# Patient Record
Sex: Male | Born: 1960 | Race: Black or African American | Hispanic: No | Smoking: Light tobacco smoker
Health system: Southern US, Community
[De-identification: ages and names within clinical notes are randomized; demographics above are authoritative.]

## PROBLEM LIST (undated history)

## (undated) DIAGNOSIS — I639 Cerebral infarction, unspecified: Secondary | ICD-10-CM

## (undated) DIAGNOSIS — J449 Chronic obstructive pulmonary disease, unspecified: Secondary | ICD-10-CM

## (undated) DIAGNOSIS — Z21 Asymptomatic human immunodeficiency virus [HIV] infection status: Secondary | ICD-10-CM

## (undated) DIAGNOSIS — I2510 Atherosclerotic heart disease of native coronary artery without angina pectoris: Secondary | ICD-10-CM

## (undated) DIAGNOSIS — B20 Human immunodeficiency virus [HIV] disease: Secondary | ICD-10-CM

## (undated) HISTORY — PX: COLOSTOMY: SHX63

## (undated) HISTORY — PX: COLON SURGERY: SHX602

---

## 2014-04-19 DIAGNOSIS — I639 Cerebral infarction, unspecified: Secondary | ICD-10-CM

## 2014-04-19 HISTORY — DX: Cerebral infarction, unspecified: I63.9

## 2014-08-04 ENCOUNTER — Emergency Department (HOSPITAL_COMMUNITY): Payer: Self-pay

## 2014-08-04 ENCOUNTER — Encounter (HOSPITAL_COMMUNITY): Payer: Self-pay | Admitting: Emergency Medicine

## 2014-08-04 ENCOUNTER — Emergency Department (HOSPITAL_COMMUNITY)
Admission: EM | Admit: 2014-08-04 | Discharge: 2014-08-04 | Disposition: A | Discharge status: Home / Self Care | Payer: Self-pay | Attending: Emergency Medicine | Admitting: Emergency Medicine

## 2014-08-04 DIAGNOSIS — Z79899 Other long term (current) drug therapy: Secondary | ICD-10-CM | POA: Insufficient documentation

## 2014-08-04 DIAGNOSIS — L98499 Non-pressure chronic ulcer of skin of other sites with unspecified severity: Secondary | ICD-10-CM

## 2014-08-04 DIAGNOSIS — Z923 Personal history of irradiation: Secondary | ICD-10-CM | POA: Insufficient documentation

## 2014-08-04 DIAGNOSIS — J449 Chronic obstructive pulmonary disease, unspecified: Secondary | ICD-10-CM | POA: Insufficient documentation

## 2014-08-04 DIAGNOSIS — R52 Pain, unspecified: Secondary | ICD-10-CM

## 2014-08-04 DIAGNOSIS — Z792 Long term (current) use of antibiotics: Secondary | ICD-10-CM | POA: Insufficient documentation

## 2014-08-04 DIAGNOSIS — Z21 Asymptomatic human immunodeficiency virus [HIV] infection status: Secondary | ICD-10-CM | POA: Insufficient documentation

## 2014-08-04 DIAGNOSIS — Z8673 Personal history of transient ischemic attack (TIA), and cerebral infarction without residual deficits: Secondary | ICD-10-CM | POA: Insufficient documentation

## 2014-08-04 DIAGNOSIS — Z880 Allergy status to penicillin: Secondary | ICD-10-CM | POA: Insufficient documentation

## 2014-08-04 DIAGNOSIS — L98419 Non-pressure chronic ulcer of buttock with unspecified severity: Secondary | ICD-10-CM | POA: Insufficient documentation

## 2014-08-04 DIAGNOSIS — Z85038 Personal history of other malignant neoplasm of large intestine: Secondary | ICD-10-CM | POA: Insufficient documentation

## 2014-08-04 DIAGNOSIS — I2510 Atherosclerotic heart disease of native coronary artery without angina pectoris: Secondary | ICD-10-CM | POA: Insufficient documentation

## 2014-08-04 DIAGNOSIS — L89219 Pressure ulcer of right hip, unspecified stage: Secondary | ICD-10-CM | POA: Insufficient documentation

## 2014-08-04 DIAGNOSIS — Z720 Tobacco use: Secondary | ICD-10-CM | POA: Insufficient documentation

## 2014-08-04 HISTORY — DX: Atherosclerotic heart disease of native coronary artery without angina pectoris: I25.10

## 2014-08-04 HISTORY — DX: Chronic obstructive pulmonary disease, unspecified: J44.9

## 2014-08-04 HISTORY — DX: Cerebral infarction, unspecified: I63.9

## 2014-08-04 HISTORY — DX: Human immunodeficiency virus (HIV) disease: B20

## 2014-08-04 HISTORY — DX: Asymptomatic human immunodeficiency virus (hiv) infection status: Z21

## 2014-08-04 LAB — CBC WITH DIFFERENTIAL/PLATELET
BASOS ABS: 0 10*3/uL (ref 0.0–0.1)
BASOS PCT: 0 % (ref 0–1)
Eosinophils Absolute: 0.2 10*3/uL (ref 0.0–0.7)
Eosinophils Relative: 4 % (ref 0–5)
HCT: 38 % — ABNORMAL LOW (ref 39.0–52.0)
Hemoglobin: 12.4 g/dL — ABNORMAL LOW (ref 13.0–17.0)
LYMPHS ABS: 2.3 10*3/uL (ref 0.7–4.0)
Lymphocytes Relative: 37 % (ref 12–46)
MCH: 27.6 pg (ref 26.0–34.0)
MCHC: 32.6 g/dL (ref 30.0–36.0)
MCV: 84.6 fL (ref 78.0–100.0)
MONOS PCT: 8 % (ref 3–12)
Monocytes Absolute: 0.5 10*3/uL (ref 0.1–1.0)
NEUTROS ABS: 3.3 10*3/uL (ref 1.7–7.7)
NEUTROS PCT: 51 % (ref 43–77)
PLATELETS: 248 10*3/uL (ref 150–400)
RBC: 4.49 MIL/uL (ref 4.22–5.81)
RDW: 15.8 % — ABNORMAL HIGH (ref 11.5–15.5)
WBC: 6.4 10*3/uL (ref 4.0–10.5)

## 2014-08-04 LAB — BASIC METABOLIC PANEL
ANION GAP: 8 (ref 5–15)
BUN: 12 mg/dL (ref 6–23)
CHLORIDE: 105 mmol/L (ref 96–112)
CO2: 23 mmol/L (ref 19–32)
CREATININE: 0.91 mg/dL (ref 0.50–1.35)
Calcium: 9.1 mg/dL (ref 8.4–10.5)
GFR calc Af Amer: >90 mL/min (ref >90)
GFR calc non Af Amer: >90 mL/min (ref >90)
Glucose, Bld: 98 mg/dL (ref 70–99)
Potassium: 4 mmol/L (ref 3.5–5.1)
Sodium: 136 mmol/L (ref 135–145)

## 2014-08-04 LAB — I-STAT CG4 LACTIC ACID, ED: LACTIC ACID, VENOUS: 0.74 mmol/L (ref 0.5–2.0)

## 2014-08-04 MED ORDER — OXYMORPHONE HCL 10 MG PO TABS: 10.0000 mg | ORAL_TABLET | Freq: Three times a day (TID) | ORAL | Status: AC | PRN

## 2014-08-04 MED ORDER — SODIUM CHLORIDE 0.9 % IV BOLUS (SEPSIS)
1000.0000 mL | Freq: Once | INTRAVENOUS | Status: AC
  Administered 2014-08-04: 1000 mL via INTRAVENOUS

## 2014-08-04 MED ORDER — HYDROMORPHONE HCL 1 MG/ML IJ SOLN
0.5000 mg | Freq: Once | INTRAMUSCULAR | Status: AC
  Administered 2014-08-04: 0.5 mg via INTRAVENOUS
  Filled 2014-08-04: qty 1

## 2014-08-04 MED ORDER — ONDANSETRON HCL 4 MG/2ML IJ SOLN
4.0000 mg | Freq: Once | INTRAMUSCULAR | Status: AC
  Administered 2014-08-04: 4 mg via INTRAVENOUS
  Filled 2014-08-04: qty 2

## 2014-08-04 MED ORDER — HYDROMORPHONE HCL 1 MG/ML IJ SOLN
0.5000 mg | Freq: Once | INTRAMUSCULAR | Status: AC
Start: 2014-08-04 — End: 2014-08-04
  Administered 2014-08-04: 0.5 mg via INTRAVENOUS
  Filled 2014-08-04: qty 1

## 2014-08-04 MED ORDER — OXYCODONE HCL 10 MG PO TABS: 10.0000 mg | ORAL_TABLET | Freq: Two times a day (BID) | ORAL | Status: AC | PRN

## 2014-08-04 NOTE — ED Notes (Signed)
Finished radiation for colon CA on December 3rd.  Has wound to right hip and sacrum from radiation.  Ran out of pain meds on Monday.  Some drainage from wound on right hip.  No foul odor noted.

## 2014-08-04 NOTE — ED Notes (Signed)
Patient transported to X-ray 

## 2014-08-04 NOTE — Discharge Instructions (Signed)
Do not hesitate to return to the emergency room for any new, worsening or concerning symptoms.  Please obtain primary care using resource guide below. But the minute you were seen in the emergency room and that they will need to obtain records for further outpatient management.

## 2014-08-04 NOTE — ED Provider Notes (Signed)
CSN: [id number]     Arrival date & time 08/04/14  0442 History   None    Chief Complaint  Patient presents with  . Wound Check     (Consider location/radiation/quality/duration/timing/severity/associated sxs/prior Treatment) HPI  Eugene Jordan is a 54 y.o. male complaining of body aches.  He has a history of HIV and colon cancer that spread to his right hip.  He began chemo and radiation for the cancer in December at [facility] her 5 weeks ago).  The radiation caused 2 local wounds - 1 on the sacrum and 1 on the right hip.  The wounds have never had pus or a foul smell. He was instructed to put silvadine on them by his oncologist, at which point the scabs became loose.  He states that he scrubs the wound on the sacrum to keep a new scab from forming.  The scab on the hip got caught on a piece of clothing 3 weeks ago and became detached. She has an appointment with his primary care doctor on the 29th of this month. He ran out of his chronic pain medication on Monday and states he now has body aches as a result.  He denies fever, chills, pus formation, or pain in the wound spreading redness.  He is able to stay hydrated and has a slight appetite.  Last CD4 count is 230.    Past Medical History  Diagnosis Date  . Coronary artery disease   . COPD (chronic obstructive pulmonary disease)   . HIV (human immunodeficiency virus infection)   . Stroke 04/19/2014    mini strokes per pt   Past Surgical History  Procedure Laterality Date  . Colon surgery    . Colostomy     No family history on file. History  Substance Use Topics  . Smoking status: Light Tobacco Smoker -- 0.25 packs/day    Types: Cigarettes  . Smokeless tobacco: Not on file  . Alcohol Use: No    Review of Systems  10 systems reviewed and found to be negative, except as noted in the HPI.  Allergies  Iodine; Demerol; Penicillins; Shellfish allergy; and Tylenol  Home Medications   Prior to Admission medications     Medication Sig Start Date End Date Taking? Authorizing Provider  albuterol (PROVENTIL) (2.5 MG/3ML) 0.083% nebulizer solution Take 2.5 mg by nebulization every 6 (six) hours as needed for wheezing or shortness of breath.   Yes Historical Provider, MD  atazanavir (REYATAZ) 150 MG capsule Take 150 mg by mouth daily with breakfast.   Yes Historical Provider, MD  atorvastatin (LIPITOR) 20 MG tablet Take 20 mg by mouth daily.   Yes Historical Provider, MD  dronabinol (MARINOL) 5 MG capsule Take 5 mg by mouth 2 (two) times daily before a meal.   Yes Historical Provider, MD  emtricitabine-tenofovir (TRUVADA) 200-300 MG per tablet Take 1 tablet by mouth daily.   Yes Historical Provider, MD  Multiple Vitamin (MULTIVITAMIN WITH MINERALS) TABS tablet Take 1 tablet by mouth daily.   Yes Historical Provider, MD  ritonavir (NORVIR) 100 MG capsule Take 100 mg by mouth daily with breakfast.   Yes Historical Provider, MD  sulfamethoxazole-trimethoprim (BACTRIM DS,SEPTRA DS) 800-160 MG per tablet Take 1 tablet by mouth daily.   Yes Historical Provider, MD  tamsulosin (FLOMAX) 0.4 MG CAPS capsule Take 0.4 mg by mouth daily.   Yes Historical Provider, MD  Oxycodone HCl 10 MG TABS Take 1 tablet (10 mg total) by mouth every 12 (twelve) hours  as needed. 08/04/14   Kali Deadwyler, PA-C  oxymorphone (OPANA) 10 MG tablet Take 1 tablet (10 mg total) by mouth every 8 (eight) hours as needed for pain. 08/04/14   Ravon Mcilhenny, PA-C   BP 155/92 mmHg  Pulse 85  Temp(Src) 98.2 F (36.8 C) (Oral)  Resp 18  Ht 6' 1.5" (1.867 m)  Wt 142 lb (64.411 kg)  BMI 18.48 kg/m2  SpO2 98% Physical Exam  Constitutional: He is oriented to person, place, and time. He appears well-developed and well-nourished. No distress.  HENT:  Head: Normocephalic.  Eyes: Conjunctivae and EOM are normal.  Neck: Normal range of motion.  Cardiovascular: Normal rate, regular rhythm and intact distal pulses.   Pulmonary/Chest: Effort normal and  breath sounds normal. No stridor. No respiratory distress. He has no wheezes. He has no rales. He exhibits no tenderness.  Abdominal: Soft. Bowel sounds are normal. He exhibits no distension and no mass. There is no tenderness. There is no rebound and no guarding.  Musculoskeletal: Normal range of motion.  Neurological: He is alert and oriented to person, place, and time.  Skin:  7 x 4 cm ulceration to the right hip, no surrounding cellulitis, no purulent drainage, warmth, tenderness to palpation. Good granulation tissue in the wound bed.  Pthas 2 x 1 cm ulceration at the proximal gluteal cleft, no surrounding cellulitis, no drainage or warmth.    Psychiatric: He has a normal mood and affect.  Nursing note and vitals reviewed.           ED Course  Procedures (including critical care time) Labs Review Labs Reviewed  CBC WITH DIFFERENTIAL/PLATELET - Abnormal; Notable for the following:    Hemoglobin 12.4 (*)    HCT 38.0 (*)    RDW 15.8 (*)    All other components within normal limits  BASIC METABOLIC PANEL  I-STAT CG4 LACTIC ACID, ED    Imaging Review Dg Hip Unilat With Pelvis 2-3 Views Right  08/04/2014   CLINICAL DATA:  Right hip pain.  History of colorectal carcinoma  EXAM: RIGHT HIP (WITH PELVIS) 2-3 VIEWS  COMPARISON:  None.  FINDINGS: Frontal pelvis as well as frontal and lateral right hip images were obtained. No fracture or dislocation. Joint spaces appear intact. No erosive change. No blastic or lytic bone lesion. There is an ostomy overlying the left iliac crest.  IMPRESSION: No fracture or dislocation. No appreciable arthropathy or neoplastic appearing lesion.   Electronically Signed   By: Bretta Bang III M.D.   On: 08/04/2014 08:17     EKG Interpretation None      MDM   Final diagnoses:  Pain  Radiation skin ulcer    Filed Vitals:   08/04/14 0715 08/04/14 0745 08/04/14 0830 08/04/14 0851  BP: 157/83 132/80 160/96 155/92  Pulse: 93 82  85  Temp:     98.2 F (36.8 C)  TempSrc:    Oral  Resp: 18   18  Height:      Weight:      SpO2: 100% 97%  98%    Medications  sodium chloride 0.9 % bolus 1,000 mL (0 mLs Intravenous Stopped 08/04/14 0759)  HYDROmorphone (DILAUDID) injection 0.5 mg (0.5 mg Intravenous Given 08/04/14 0659)  ondansetron (ZOFRAN) injection 4 mg (4 mg Intravenous Given 08/04/14 0659)  HYDROmorphone (DILAUDID) injection 0.5 mg (0.5 mg Intravenous Given 08/04/14 0835)    Eugene Jordan is a pleasant 54 y.o. male presenting with pain after running out of his home Opana  and oxycodone. Patient recently moved to the area, has HIV, metastatic colon cancer. 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 Wounds show no sign of infection. Patient is afebrile, well-appearing, no elevation lactic acid or white blood count. X-ray with no abnormalities. Patient will be given referral to oncology, infectious disease, and the wound center.  This is a shared visit with the attending physician who personally evaluated the patient and agrees with the care plan.   Evaluation does not show pathology that would require ongoing emergent intervention or inpatient treatment. Pt is hemodynamically stable and mentating appropriately. Discussed findings and plan with patient/guardian, who agrees with care plan. All questions answered. Return precautions discussed and outpatient follow up given.    Wynetta Emeryicole Rajat Staver, PA-C 08/04/14 1215  Glynn OctaveStephen Rancour, MD 08/04/14 [phone]

## 2014-08-04 NOTE — ED Notes (Signed)
Patient did well ambulating in Hallway with his cane.  Stated his pain level has improved.

## 2014-08-04 NOTE — ED Notes (Signed)
Pt. Able to ambulate independently in hallway with personal cane. No distress/SOB/Dizziness reported.

## 2016-02-10 IMAGING — CR DG HIP (WITH OR WITHOUT PELVIS) 2-3V*R*
3 series · 3 of 3 positions shown · non-contrast
Comparison: None.

CLINICAL DATA: Right hip pain.  History of colorectal carcinoma

EXAM:
RIGHT HIP (WITH PELVIS) 2-3 VIEWS

[pelvis ap]
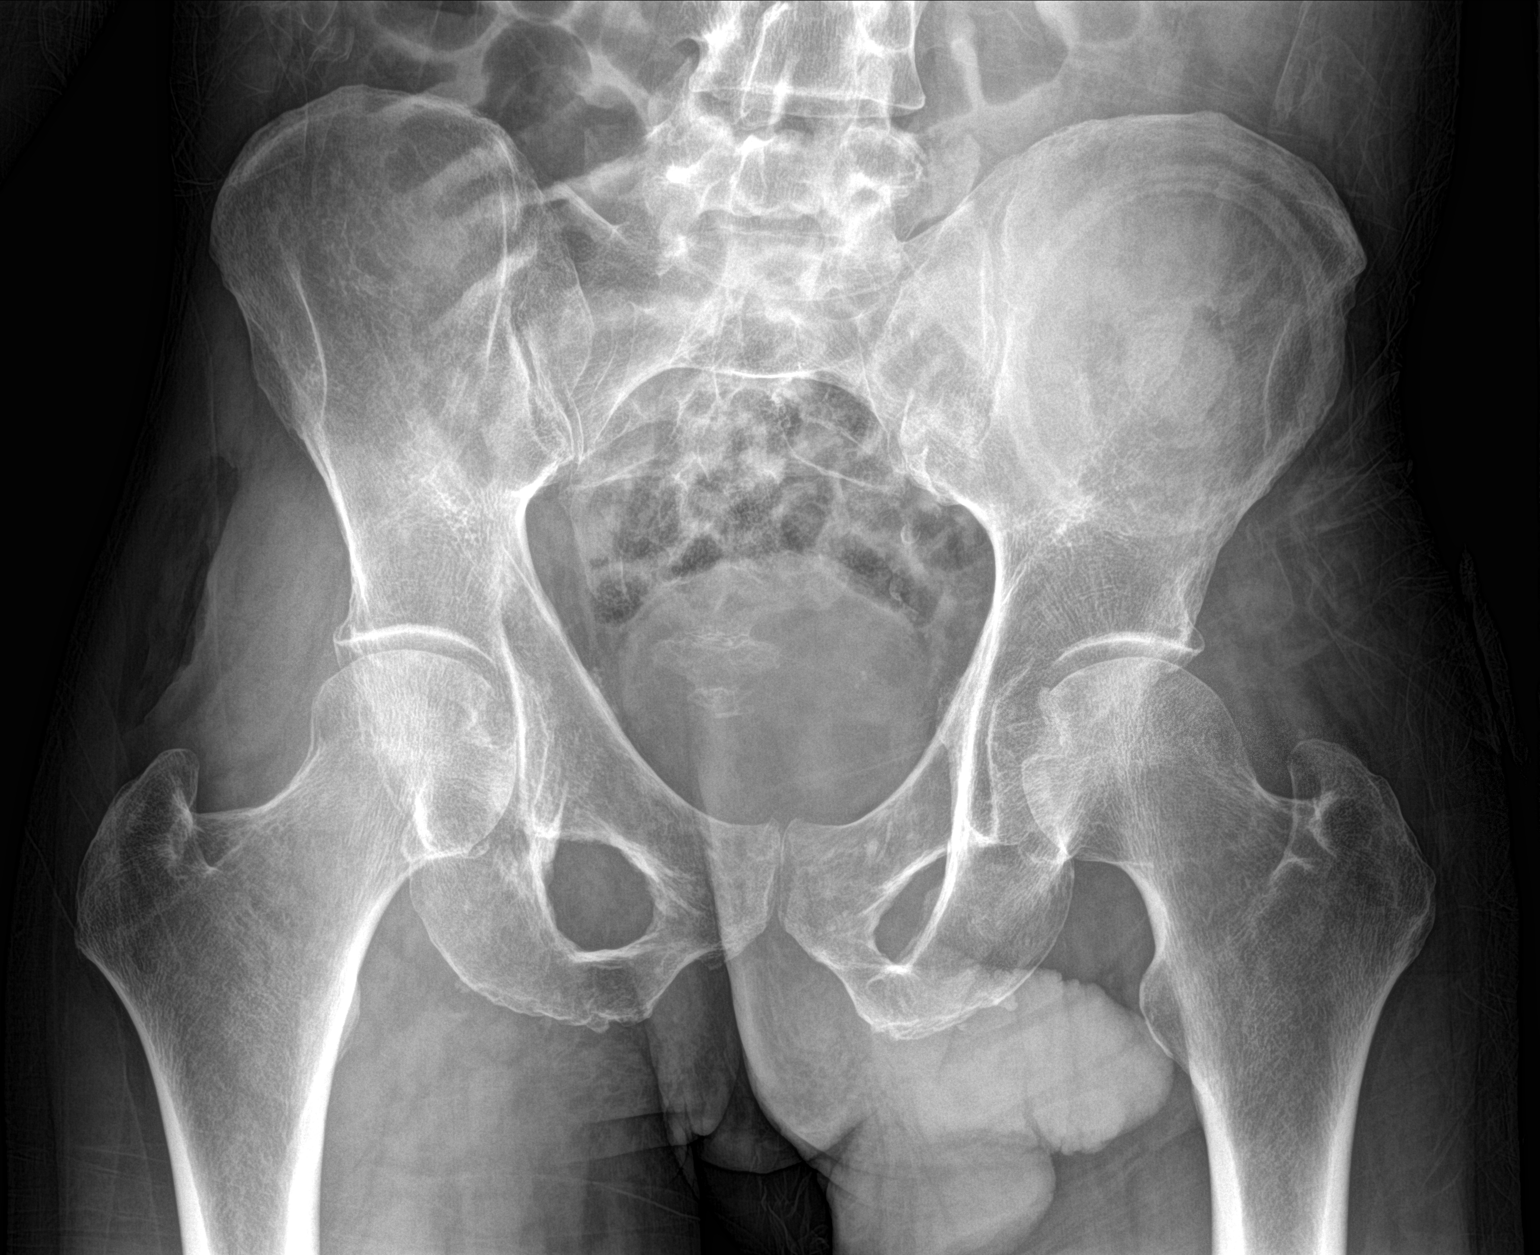

[hip ap]
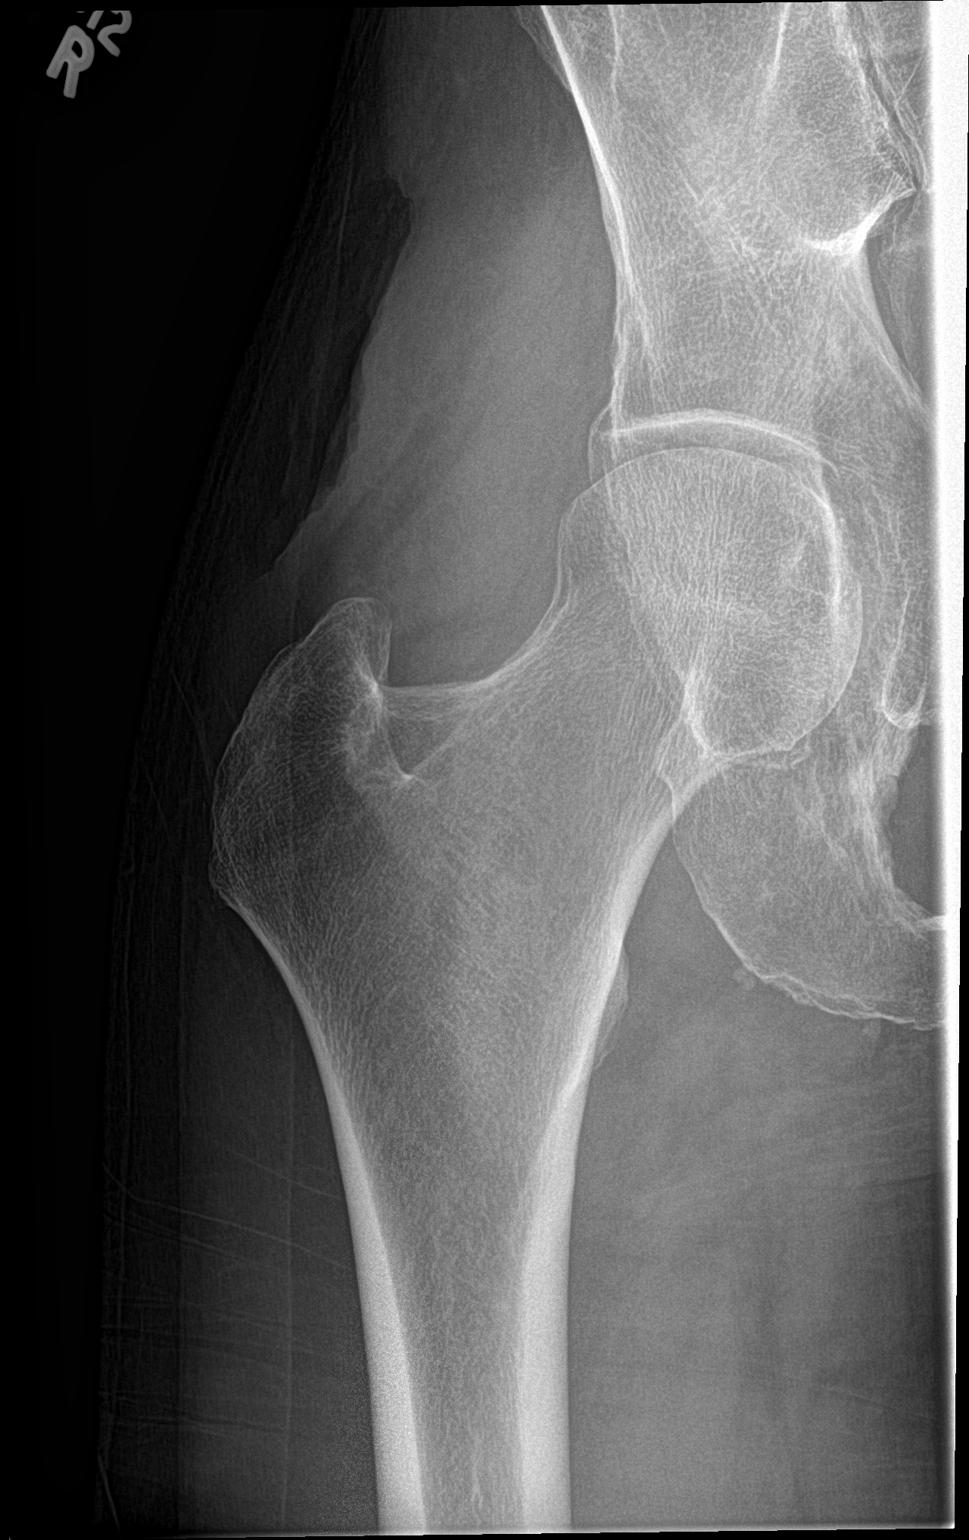

[hip lat]
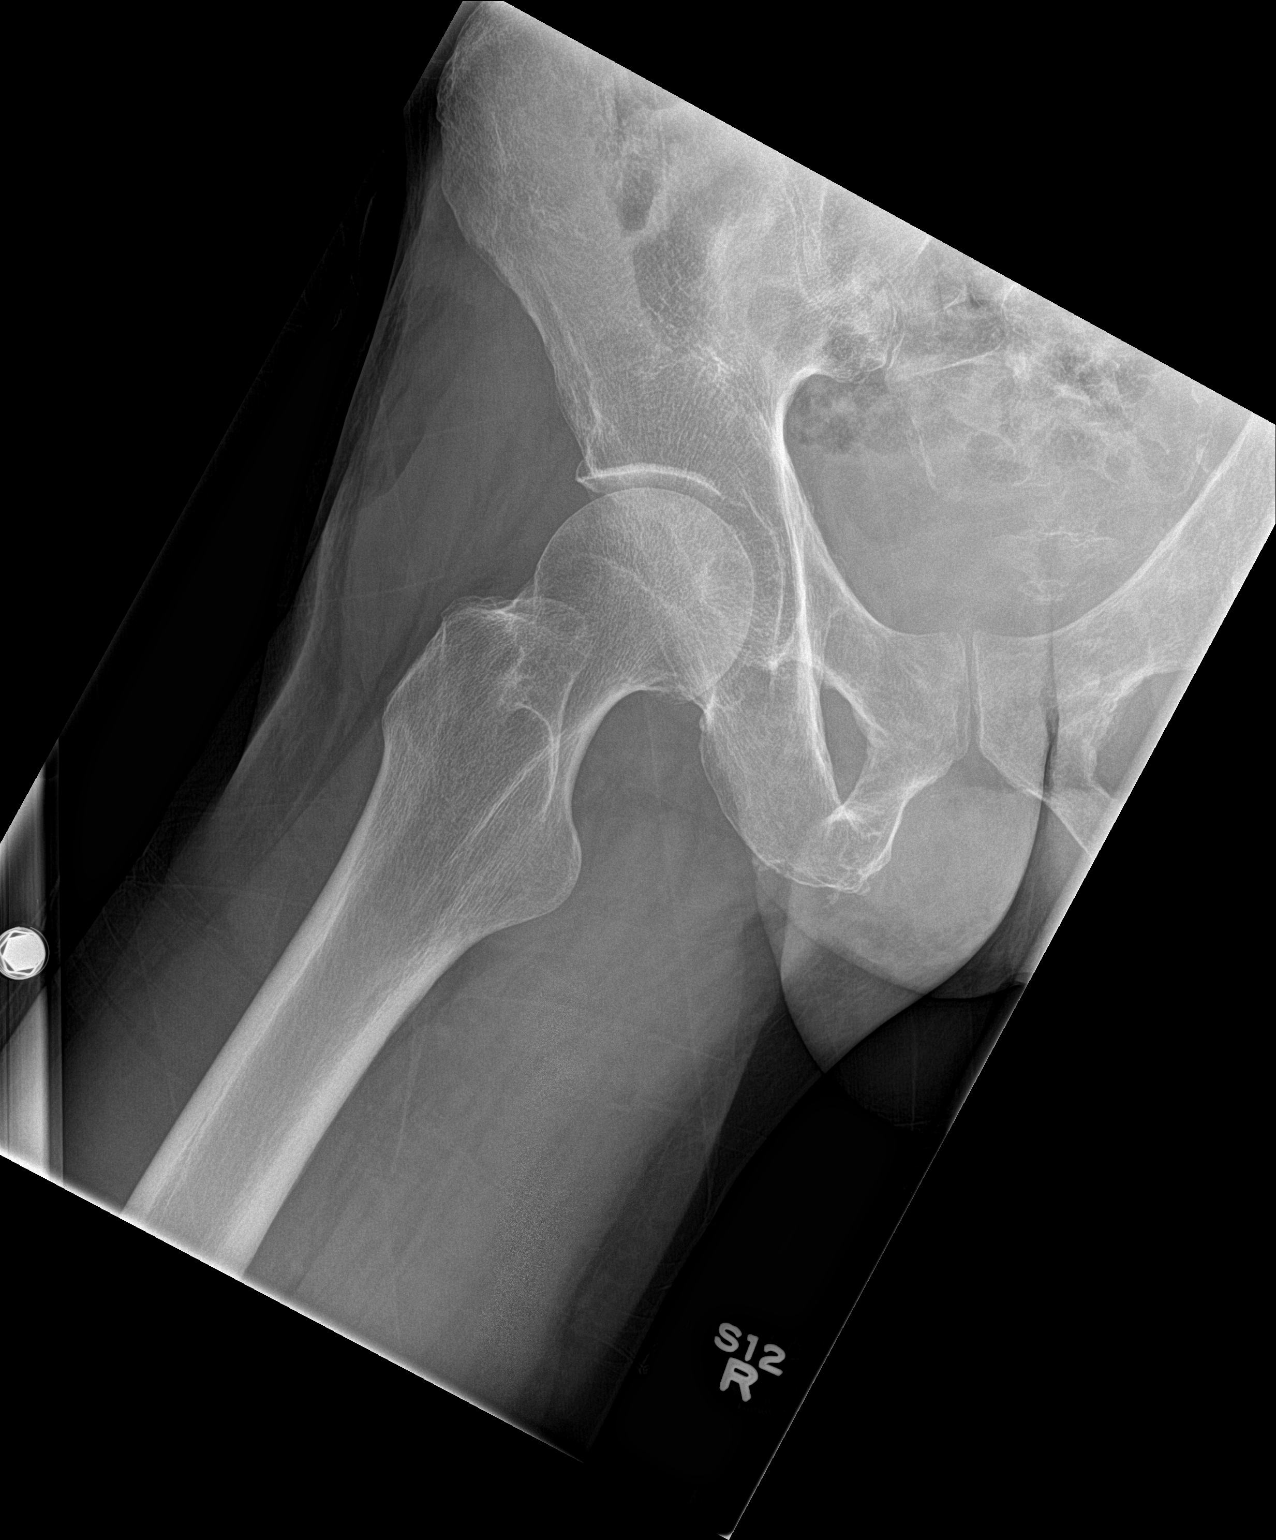

[3 of 3 positions shown; findings below may reference images not displayed]

FINDINGS: Frontal pelvis as well as frontal and lateral right hip images were
obtained. No fracture or dislocation. Joint spaces appear intact. No
erosive change. No blastic or lytic bone lesion. There is an ostomy
overlying the left iliac crest.
IMPRESSION: No fracture or dislocation. No appreciable arthropathy or neoplastic
appearing lesion.
